# Patient Record
Sex: Male | Born: 1955 | Race: White | Hispanic: No | Marital: Married | Smoking: Former smoker
Health system: Southern US, Community
[De-identification: ages and names within clinical notes are randomized; demographics above are authoritative.]

## PROBLEM LIST (undated history)

## (undated) DIAGNOSIS — I10 Essential (primary) hypertension: Secondary | ICD-10-CM

## (undated) DIAGNOSIS — E785 Hyperlipidemia, unspecified: Secondary | ICD-10-CM

## (undated) HISTORY — PX: APPENDECTOMY: SHX54

---

## 2016-10-14 ENCOUNTER — Emergency Department: Payer: BLUE CROSS/BLUE SHIELD

## 2016-10-14 ENCOUNTER — Encounter: Payer: Self-pay | Admitting: Emergency Medicine

## 2016-10-14 ENCOUNTER — Emergency Department: Payer: BLUE CROSS/BLUE SHIELD | Admitting: Anesthesiology

## 2016-10-14 ENCOUNTER — Observation Stay
Admission: EM | Admit: 2016-10-14 | Discharge: 2016-10-15 | Disposition: A | Discharge status: Home / Self Care | Payer: BLUE CROSS/BLUE SHIELD | Attending: Orthopedic Surgery | Admitting: Orthopedic Surgery

## 2016-10-14 ENCOUNTER — Encounter
Admission: EM | Disposition: A | Discharge status: Home / Self Care | Payer: Self-pay | Source: Home / Self Care | Attending: Emergency Medicine

## 2016-10-14 DIAGNOSIS — S52572A Other intraarticular fracture of lower end of left radius, initial encounter for closed fracture: Secondary | ICD-10-CM | POA: Diagnosis not present

## 2016-10-14 DIAGNOSIS — E785 Hyperlipidemia, unspecified: Secondary | ICD-10-CM | POA: Insufficient documentation

## 2016-10-14 DIAGNOSIS — Z79899 Other long term (current) drug therapy: Secondary | ICD-10-CM | POA: Insufficient documentation

## 2016-10-14 DIAGNOSIS — Z87891 Personal history of nicotine dependence: Secondary | ICD-10-CM | POA: Insufficient documentation

## 2016-10-14 DIAGNOSIS — I10 Essential (primary) hypertension: Secondary | ICD-10-CM | POA: Insufficient documentation

## 2016-10-14 DIAGNOSIS — Z6831 Body mass index (BMI) 31.0-31.9, adult: Secondary | ICD-10-CM | POA: Insufficient documentation

## 2016-10-14 DIAGNOSIS — Y92410 Unspecified street and highway as the place of occurrence of the external cause: Secondary | ICD-10-CM | POA: Diagnosis not present

## 2016-10-14 DIAGNOSIS — R1084 Generalized abdominal pain: Secondary | ICD-10-CM | POA: Diagnosis not present

## 2016-10-14 DIAGNOSIS — E669 Obesity, unspecified: Secondary | ICD-10-CM | POA: Diagnosis not present

## 2016-10-14 DIAGNOSIS — S32018A Other fracture of first lumbar vertebra, initial encounter for closed fracture: Secondary | ICD-10-CM | POA: Diagnosis not present

## 2016-10-14 DIAGNOSIS — S20219A Contusion of unspecified front wall of thorax, initial encounter: Secondary | ICD-10-CM | POA: Diagnosis not present

## 2016-10-14 DIAGNOSIS — S32028A Other fracture of second lumbar vertebra, initial encounter for closed fracture: Secondary | ICD-10-CM | POA: Insufficient documentation

## 2016-10-14 DIAGNOSIS — S32009A Unspecified fracture of unspecified lumbar vertebra, initial encounter for closed fracture: Secondary | ICD-10-CM

## 2016-10-14 DIAGNOSIS — S52502A Unspecified fracture of the lower end of left radius, initial encounter for closed fracture: Secondary | ICD-10-CM | POA: Diagnosis present

## 2016-10-14 DIAGNOSIS — Z23 Encounter for immunization: Secondary | ICD-10-CM | POA: Insufficient documentation

## 2016-10-14 DIAGNOSIS — S62102A Fracture of unspecified carpal bone, left wrist, initial encounter for closed fracture: Secondary | ICD-10-CM

## 2016-10-14 DIAGNOSIS — R0789 Other chest pain: Secondary | ICD-10-CM

## 2016-10-14 DIAGNOSIS — S61411A Laceration without foreign body of right hand, initial encounter: Secondary | ICD-10-CM

## 2016-10-14 HISTORY — DX: Essential (primary) hypertension: I10

## 2016-10-14 HISTORY — DX: Hyperlipidemia, unspecified: E78.5

## 2016-10-14 HISTORY — PX: ORIF WRIST FRACTURE: SHX2133

## 2016-10-14 LAB — CBC
HCT: 41.6 % (ref 40.0–52.0)
Hemoglobin: 14.3 g/dL (ref 13.0–18.0)
MCH: 31 pg (ref 26.0–34.0)
MCHC: 34.5 g/dL (ref 32.0–36.0)
MCV: 89.9 fL (ref 80.0–100.0)
PLATELETS: 167 10*3/uL (ref 150–440)
RBC: 4.62 MIL/uL (ref 4.40–5.90)
RDW: 12.8 % (ref 11.5–14.5)
WBC: 16.6 10*3/uL — AB (ref 3.8–10.6)

## 2016-10-14 LAB — BASIC METABOLIC PANEL
Anion gap: 7 (ref 5–15)
BUN: 11 mg/dL (ref 6–20)
CALCIUM: 8.7 mg/dL — AB (ref 8.9–10.3)
CO2: 24 mmol/L (ref 22–32)
CREATININE: 0.87 mg/dL (ref 0.61–1.24)
Chloride: 110 mmol/L (ref 101–111)
GFR calc Af Amer: >60 mL/min (ref >60)
Glucose, Bld: 122 mg/dL — ABNORMAL HIGH (ref 65–99)
Potassium: 3.6 mmol/L (ref 3.5–5.1)
SODIUM: 141 mmol/L (ref 135–145)

## 2016-10-14 SURGERY — OPEN REDUCTION INTERNAL FIXATION (ORIF) WRIST FRACTURE
Anesthesia: General | Laterality: Left | Wound class: Clean

## 2016-10-14 MED ORDER — HYDROCODONE-ACETAMINOPHEN 7.5-325 MG PO TABS
1.0000 | ORAL_TABLET | Freq: Four times a day (QID) | ORAL | Status: DC
  Administered 2016-10-15 (×3): 1 via ORAL
  Filled 2016-10-14 (×3): qty 1

## 2016-10-14 MED ORDER — MORPHINE SULFATE (PF) 4 MG/ML IV SOLN
4.0000 mg | Freq: Once | INTRAVENOUS | Status: AC
  Administered 2016-10-14: 4 mg via INTRAVENOUS
  Filled 2016-10-14: qty 1

## 2016-10-14 MED ORDER — FENTANYL CITRATE (PF) 100 MCG/2ML IJ SOLN
INTRAMUSCULAR | Status: AC
  Administered 2016-10-14: 50 ug via INTRAVENOUS
  Filled 2016-10-14: qty 2

## 2016-10-14 MED ORDER — LIDOCAINE HCL (PF) 1 % IJ SOLN
2.0000 mL | Freq: Once | INTRAMUSCULAR | Status: DC
  Filled 2016-10-14: qty 5

## 2016-10-14 MED ORDER — GLYCOPYRROLATE 0.2 MG/ML IJ SOLN
INTRAMUSCULAR | Status: AC
  Filled 2016-10-14: qty 1

## 2016-10-14 MED ORDER — LIDOCAINE HCL (PF) 1 % IJ SOLN
INTRAMUSCULAR | Status: AC
  Filled 2016-10-14: qty 5

## 2016-10-14 MED ORDER — POTASSIUM CHLORIDE IN NACL 20-0.9 MEQ/L-% IV SOLN
INTRAVENOUS | Status: DC
  Administered 2016-10-15: 75 mL/h via INTRAVENOUS
  Filled 2016-10-14 (×3): qty 1000

## 2016-10-14 MED ORDER — HYDROMORPHONE HCL 1 MG/ML IJ SOLN
1.0000 mg | INTRAMUSCULAR | Status: DC | PRN
  Administered 2016-10-15 (×2): 1 mg via INTRAVENOUS
  Filled 2016-10-14 (×3): qty 1

## 2016-10-14 MED ORDER — BUPIVACAINE HCL 0.5 % IJ SOLN
5.0000 mL | Freq: Once | INTRAMUSCULAR | Status: DC
  Filled 2016-10-14: qty 5

## 2016-10-14 MED ORDER — OXYCODONE HCL 5 MG PO TABS
5.0000 mg | ORAL_TABLET | ORAL | Status: DC | PRN
  Administered 2016-10-15: 10 mg via ORAL
  Filled 2016-10-14: qty 2

## 2016-10-14 MED ORDER — ACETAMINOPHEN 650 MG RE SUPP: 650.0000 mg | Freq: Four times a day (QID) | RECTAL | Status: DC | PRN

## 2016-10-14 MED ORDER — BUPIVACAINE HCL (PF) 0.5 % IJ SOLN
INTRAMUSCULAR | Status: AC
  Filled 2016-10-14: qty 30

## 2016-10-14 MED ORDER — ONDANSETRON HCL 4 MG/2ML IJ SOLN
INTRAMUSCULAR | Status: AC
  Filled 2016-10-14: qty 2

## 2016-10-14 MED ORDER — FENTANYL CITRATE (PF) 250 MCG/5ML IJ SOLN
INTRAMUSCULAR | Status: AC
  Filled 2016-10-14: qty 5

## 2016-10-14 MED ORDER — OXYCODONE HCL 5 MG/5ML PO SOLN: 5.0000 mg | Freq: Once | ORAL | Status: DC | PRN

## 2016-10-14 MED ORDER — LIDOCAINE HCL (PF) 2 % IJ SOLN
INTRAMUSCULAR | Status: AC
  Filled 2016-10-14: qty 2

## 2016-10-14 MED ORDER — SUCCINYLCHOLINE CHLORIDE 20 MG/ML IJ SOLN
INTRAMUSCULAR | Status: DC | PRN
  Administered 2016-10-14: 100 mg via INTRAVENOUS

## 2016-10-14 MED ORDER — GLYCOPYRROLATE 0.2 MG/ML IJ SOLN
INTRAMUSCULAR | Status: DC | PRN
  Administered 2016-10-14: 0.2 mg via INTRAVENOUS

## 2016-10-14 MED ORDER — MEPERIDINE HCL 50 MG/ML IJ SOLN: 6.2500 mg | INTRAMUSCULAR | Status: DC | PRN

## 2016-10-14 MED ORDER — IOPAMIDOL (ISOVUE-300) INJECTION 61%
100.0000 mL | Freq: Once | INTRAVENOUS | Status: AC | PRN
  Administered 2016-10-14: 100 mL via INTRAVENOUS
  Filled 2016-10-14: qty 100

## 2016-10-14 MED ORDER — FENTANYL CITRATE (PF) 100 MCG/2ML IJ SOLN
INTRAMUSCULAR | Status: DC | PRN
  Administered 2016-10-14 (×5): 50 ug via INTRAVENOUS

## 2016-10-14 MED ORDER — DIPHENHYDRAMINE HCL 12.5 MG/5ML PO ELIX
12.5000 mg | ORAL_SOLUTION | ORAL | Status: DC | PRN
  Administered 2016-10-15: 12.5 mg via ORAL
  Filled 2016-10-14: qty 5

## 2016-10-14 MED ORDER — HYDROMORPHONE HCL 1 MG/ML IJ SOLN
INTRAMUSCULAR | Status: AC
  Administered 2016-10-15: 0.5 mg via INTRAVENOUS
  Filled 2016-10-14: qty 1

## 2016-10-14 MED ORDER — FENTANYL CITRATE (PF) 100 MCG/2ML IJ SOLN
25.0000 ug | INTRAMUSCULAR | Status: DC | PRN
  Administered 2016-10-14 – 2016-10-15 (×2): 50 ug via INTRAVENOUS

## 2016-10-14 MED ORDER — LIDOCAINE HCL (CARDIAC) 20 MG/ML IV SOLN
INTRAVENOUS | Status: DC | PRN
  Administered 2016-10-14: 50 mg via INTRAVENOUS

## 2016-10-14 MED ORDER — MIDAZOLAM HCL 2 MG/2ML IJ SOLN
INTRAMUSCULAR | Status: AC
  Filled 2016-10-14: qty 2

## 2016-10-14 MED ORDER — TETANUS-DIPHTH-ACELL PERTUSSIS 5-2.5-18.5 LF-MCG/0.5 IM SUSP
0.5000 mL | Freq: Once | INTRAMUSCULAR | Status: AC
  Administered 2016-10-14: 0.5 mL via INTRAMUSCULAR
  Filled 2016-10-14: qty 0.5

## 2016-10-14 MED ORDER — LACTATED RINGERS IV SOLN
INTRAVENOUS | Status: DC | PRN
  Administered 2016-10-14: 22:00:00 via INTRAVENOUS

## 2016-10-14 MED ORDER — PROPOFOL 10 MG/ML IV BOLUS
INTRAVENOUS | Status: AC
  Filled 2016-10-14: qty 20

## 2016-10-14 MED ORDER — LIDOCAINE HCL 1 % IJ SOLN
5.0000 mL | Freq: Once | INTRAMUSCULAR | Status: AC
  Administered 2016-10-14: 5 mL via INTRADERMAL
  Filled 2016-10-14: qty 5

## 2016-10-14 MED ORDER — MIDAZOLAM HCL 2 MG/2ML IJ SOLN
INTRAMUSCULAR | Status: DC | PRN
  Administered 2016-10-14: 2 mg via INTRAVENOUS

## 2016-10-14 MED ORDER — PROPOFOL 10 MG/ML IV BOLUS
INTRAVENOUS | Status: DC | PRN
  Administered 2016-10-14: 30 mg via INTRAVENOUS
  Administered 2016-10-14: 170 mg via INTRAVENOUS

## 2016-10-14 MED ORDER — SUCCINYLCHOLINE CHLORIDE 20 MG/ML IJ SOLN
INTRAMUSCULAR | Status: AC
  Filled 2016-10-14: qty 1

## 2016-10-14 MED ORDER — ACETAMINOPHEN 325 MG PO TABS: 650.0000 mg | ORAL_TABLET | Freq: Four times a day (QID) | ORAL | Status: DC | PRN

## 2016-10-14 MED ORDER — OXYCODONE HCL 5 MG PO TABS: 5.0000 mg | ORAL_TABLET | Freq: Once | ORAL | Status: DC | PRN

## 2016-10-14 MED ORDER — MORPHINE SULFATE (PF) 2 MG/ML IV SOLN: 2.0000 mg | Freq: Once | INTRAVENOUS | Status: DC

## 2016-10-14 MED ORDER — PROMETHAZINE HCL 25 MG/ML IJ SOLN: 6.2500 mg | INTRAMUSCULAR | Status: DC | PRN

## 2016-10-14 MED ORDER — ONDANSETRON HCL 4 MG/2ML IJ SOLN
INTRAMUSCULAR | Status: DC | PRN
  Administered 2016-10-14: 4 mg via INTRAVENOUS

## 2016-10-14 MED ORDER — ACETAMINOPHEN 500 MG PO TABS
1000.0000 mg | ORAL_TABLET | Freq: Four times a day (QID) | ORAL | Status: DC
  Administered 2016-10-15 (×2): 1000 mg via ORAL
  Filled 2016-10-14 (×3): qty 2

## 2016-10-14 MED ORDER — DOCUSATE SODIUM 100 MG PO CAPS
100.0000 mg | ORAL_CAPSULE | Freq: Two times a day (BID) | ORAL | Status: DC
  Administered 2016-10-15: 100 mg via ORAL
  Filled 2016-10-14: qty 1

## 2016-10-14 SURGICAL SUPPLY — 41 items
BANDAGE ACE 3X5.8 VEL STRL LF (GAUZE/BANDAGES/DRESSINGS) ×3 IMPLANT
BNDG ESMARK 4X12 TAN STRL LF (GAUZE/BANDAGES/DRESSINGS) ×3 IMPLANT
CANISTER SUCT 1200ML W/VALVE (MISCELLANEOUS) ×3 IMPLANT
CLOSURE WOUND 1/2 X4 (GAUZE/BANDAGES/DRESSINGS) ×1
CUFF TOURN 18 STER (MISCELLANEOUS) ×3 IMPLANT
DRAPE FLUOR MINI C-ARM 54X84 (DRAPES) ×3 IMPLANT
DRILL BIT WRIST (BIT) ×3 IMPLANT
DRSG DERMACEA 8X12 NADH (GAUZE/BANDAGES/DRESSINGS) ×3 IMPLANT
DURAPREP 26ML APPLICATOR (WOUND CARE) ×3 IMPLANT
ELECT REM PT RETURN 9FT ADLT (ELECTROSURGICAL) ×3
ELECTRODE REM PT RTRN 9FT ADLT (ELECTROSURGICAL) ×1 IMPLANT
GAUZE SPONGE 4X4 12PLY STRL (GAUZE/BANDAGES/DRESSINGS) ×3 IMPLANT
GLOVE BIOGEL M STRL SZ7.5 (GLOVE) ×9 IMPLANT
GLOVE INDICATOR 8.0 STRL GRN (GLOVE) ×9 IMPLANT
GOWN STRL REUS W/ TWL LRG LVL3 (GOWN DISPOSABLE) ×2 IMPLANT
GOWN STRL REUS W/TWL LRG LVL3 (GOWN DISPOSABLE) ×4
KIT RM TURNOVER STRD PROC AR (KITS) ×3 IMPLANT
NEEDLE FILTER BLUNT 18X 1/2SAF (NEEDLE) ×2
NEEDLE FILTER BLUNT 18X1 1/2 (NEEDLE) ×1 IMPLANT
NS IRRIG 500ML POUR BTL (IV SOLUTION) ×3 IMPLANT
PACK EXTREMITY ARMC (MISCELLANEOUS) ×3 IMPLANT
PAD CAST CTTN 4X4 STRL (SOFTGOODS) ×2 IMPLANT
PADDING CAST COTTON 4X4 STRL (SOFTGOODS) ×4
PLATE RADIUS DISTAL VARIAX (Plate) ×3 IMPLANT
SCREW BONE 2.7X14 (Screw) ×3 IMPLANT
SCREW BONE 2.7X26MM (Screw) ×3 IMPLANT
SCREW LOCKING 2.7X16 (Screw) ×6 IMPLANT
SCREW LOCKING 2.7X18 (Screw) ×3 IMPLANT
SCREW LOCKING 2.7X20 (Screw) ×9 IMPLANT
SCREW LOCKING 2.7X22 (Screw) ×3 IMPLANT
SCREW LOCKING 2.7X24MM (Screw) ×6 IMPLANT
SCREW LOCKING 2.7X26MM (Screw) ×3 IMPLANT
SPLINT CAST 1 STEP 3X12 (MISCELLANEOUS) ×3 IMPLANT
SPONGE LAP 18X18 5 PK (GAUZE/BANDAGES/DRESSINGS) ×3 IMPLANT
STOCKINETTE STRL 4IN 9604848 (GAUZE/BANDAGES/DRESSINGS) ×3 IMPLANT
STRIP CLOSURE SKIN 1/2X4 (GAUZE/BANDAGES/DRESSINGS) ×2 IMPLANT
SUT ETHILON 2 0 FS 18 (SUTURE) ×3 IMPLANT
SUT VIC AB 0 CT2 27 (SUTURE) ×3 IMPLANT
SUT VIC AB 2-0 SH 27 (SUTURE) ×2
SUT VIC AB 2-0 SH 27XBRD (SUTURE) ×1 IMPLANT
SYRINGE 10CC LL (SYRINGE) ×3 IMPLANT

## 2016-10-14 NOTE — ED Notes (Signed)
Family at bedside.  Informed that patient will be going to OR shortly.  Understanding verbalized.
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
## 2016-10-14 NOTE — Anesthesia Post-op Follow-up Note (Cosign Needed)
Anesthesia QCDR form completed.        
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
## 2016-10-14 NOTE — Anesthesia Procedure Notes (Signed)
Procedure Name: Intubation Performed by: Mathews ArgyleLOGAN, Adam Delgado Pre-anesthesia Checklist: Patient identified, Patient being monitored, Timeout performed, Emergency Drugs available and Suction available Patient Re-evaluated:Patient Re-evaluated prior to induction Oxygen Delivery Method: Circle system utilized Preoxygenation: Pre-oxygenation with 100% oxygen Induction Type: IV induction, Rapid sequence and Cricoid Pressure applied Ventilation: Mask ventilation without difficulty Laryngoscope Size: McGraph and 4 Grade View: Grade III Tube type: Oral Tube size: 7.5 mm Number of attempts: 1 Airway Equipment and Method: Stylet and Video-laryngoscopy Placement Confirmation: ETT inserted through vocal cords under direct vision,  positive ETCO2 and breath sounds checked- equal and bilateral Secured at: 23 cm Tube secured with: Tape Dental Injury: Teeth and Oropharynx as per pre-operative assessment  Difficulty Due To: Difficulty was unanticipated, Difficult Airway- due to large tongue and Difficult Airway- due to immobile epiglottis Future Recommendations: Recommend- induction with short-acting agent, and alternative techniques readily available
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
## 2016-10-14 NOTE — Transfer of Care (Signed)
Immediate Anesthesia Transfer of Care Note  Patient: Adam Delgado  Procedure(s) Performed: Procedure(s): OPEN REDUCTION INTERNAL FIXATION (ORIF) WRIST FRACTURE (Left)  Patient Location: PACU  Anesthesia Type:General  Level of Consciousness: awake and alert   Airway & Oxygen Therapy: Patient Spontanous Breathing and Patient connected to face mask oxygen  Post-op Assessment: Report given to RN and Post -op Vital signs reviewed and stable  Post vital signs: Reviewed  Last Vitals:  Vitals:   10/14/16 1900 10/14/16 2345  BP: 110/78 (!) 154/86  Pulse:    Resp: 16 20  Temp:  37.6 C    Last Pain:  Vitals:   10/14/16 1630  TempSrc:   PainSc: 10-Worst pain ever      Patients Stated Pain Goal: 2 (10/14/16 1630)  Complications: No apparent anesthesia complications

## 2016-10-15 LAB — GLUCOSE, CAPILLARY: GLUCOSE-CAPILLARY: 134 mg/dL — AB (ref 65–99)

## 2016-10-15 MED ORDER — HYDROMORPHONE HCL 1 MG/ML IJ SOLN
INTRAMUSCULAR | Status: AC
  Filled 2016-10-15: qty 1

## 2016-10-15 MED ORDER — METOCLOPRAMIDE HCL 5 MG/ML IJ SOLN: 5.0000 mg | Freq: Three times a day (TID) | INTRAMUSCULAR | Status: DC | PRN

## 2016-10-15 MED ORDER — DOCUSATE SODIUM 100 MG PO CAPS: 100.0000 mg | ORAL_CAPSULE | Freq: Two times a day (BID) | ORAL | 0 refills | Status: AC

## 2016-10-15 MED ORDER — HYDROCODONE-ACETAMINOPHEN 7.5-325 MG PO TABS: 1.0000 | ORAL_TABLET | Freq: Four times a day (QID) | ORAL | 0 refills | Status: AC

## 2016-10-15 MED ORDER — HYDROMORPHONE HCL 1 MG/ML IJ SOLN
0.5000 mg | INTRAMUSCULAR | Status: DC | PRN
  Administered 2016-10-15 (×3): 0.5 mg via INTRAVENOUS

## 2016-10-15 MED ORDER — METOCLOPRAMIDE HCL 10 MG PO TABS: 5.0000 mg | ORAL_TABLET | Freq: Three times a day (TID) | ORAL | Status: DC | PRN

## 2016-10-15 MED ORDER — ONDANSETRON HCL 4 MG/2ML IJ SOLN: 4.0000 mg | Freq: Four times a day (QID) | INTRAMUSCULAR | Status: DC | PRN

## 2016-10-15 MED ORDER — ONDANSETRON HCL 4 MG PO TABS: 4.0000 mg | ORAL_TABLET | Freq: Four times a day (QID) | ORAL | Status: DC | PRN
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
## 2016-10-15 NOTE — Progress Notes (Signed)
Patient will be discharged to home today. Waiting on delivery of bariatric walker with arm rest to be delivered in order for the patient to go home. Family and patient are feeling a little impatient and want to go home without the walker. This nurse told the patient that it is important to go home with the equipment needed in order to get around safely. Explained that deliveries on Sunday may take a little longer than during the week.
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
## 2016-10-15 NOTE — Progress Notes (Signed)
Subjective: 61 years old male postoperative day 1 status post open reduction internal fixation left distal radius fracture dislocation. No complaints of acute pain at this time.   Objective: Vital signs in last 24 hours: Temp:  [98.3 F (36.8 C)-99.6 F (37.6 C)] 99.2 F (37.3 C) (07/29 0743) Pulse Rate:  [73-115] 103 (07/29 0743) Resp:  [10-20] 19 (07/29 0743) BP: (95-154)/(67-88) 95/67 (07/29 0743) SpO2:  [95 %-100 %] 97 % (07/29 0743) Weight:  [99.8 kg (220 lb)] 99.8 kg (220 lb) (07/28 1629)  Intake/Output from previous day: 07/28 0701 - 07/29 0700 In: 1148.8 [P.O.:240; I.V.:908.8] Out: 350 [Urine:350] Intake/Output this shift: No intake/output data recorded.   Recent Labs  10/14/16 1931  HGB 14.3    Recent Labs  10/14/16 1931  WBC 16.6*  RBC 4.62  HCT 41.6  PLT 167    Recent Labs  10/14/16 1931  NA 141  K 3.6  CL 110  CO2 24  BUN 11  CREATININE 0.87  GLUCOSE 122*  CALCIUM 8.7*   No results for input(s): LABPT, INR in the last 72 hours.  Patient currently has a volar splint in place. He has intact flexion and extension of all the digits. Neurovascular intact  Brisk capillary refill is present. Sensations are intact in radial and ulnar border of all the digits.  Assessment/Plan: 61 years old male postoperative day 1 status post open reduction internal fixation left distal radius intra-articular fracture. Patient has concomitant rib contusions as well as transverse process fractures involving his lumbar spine. Patient will work with physical therapy today. [place] [place] Patient will be discharged home today. He will follow[place] [facility] [place] [facility] . Patient will have the stitches removed in 2 weeks. He will be switched to a short arm cast for 4 weeks.    Bedelia Pong 10/15/2016, 10:30 AM

## 2016-10-15 NOTE — Anesthesia Postprocedure Evaluation (Signed)
Anesthesia Post Note  Patient: MudloggerJohnny Morrical  Procedure(s) Performed: Procedure(s) (LRB): OPEN REDUCTION INTERNAL FIXATION (ORIF) WRIST FRACTURE (Left)  Patient location during evaluation: PACU Anesthesia Type: General Level of consciousness: awake and alert and oriented Pain management: pain level controlled Vital Signs Assessment: post-procedure vital signs reviewed and stable Respiratory status: spontaneous breathing, nonlabored ventilation and respiratory function stable Cardiovascular status: blood pressure returned to baseline and stable Postop Assessment: no signs of nausea or vomiting Anesthetic complications: no     Last Vitals:  Vitals:   10/15/16 0046 10/15/16 0131  BP: 114/70 136/67  Pulse: 98 94  Resp: 13 18  Temp: 37.4 C 37 C    Last Pain:  Vitals:   10/15/16 0142  TempSrc:   PainSc: 8                  Dorcus Riga

## 2016-10-15 NOTE — Pre-Procedure Instructions (Signed)
Note needs to be done by Ortho

## 2016-10-15 NOTE — Progress Notes (Signed)
Patient is being discharged to home. DC & RX instructions given and family acknowledged understanding. Walker delivered. IV removed. Son here to drive patient home.

## 2016-10-15 NOTE — Care Management Obs Status (Signed)
MEDICARE OBSERVATION STATUS NOTIFICATION   Patient Details  Name: Adam Delgado MRN: [id number] Date of Birth: 03-18-1956   Medicare Observation Status Notification Given:  No  Discharged within 24 hours    Lamisha Roussell A, RN 10/15/2016, 11:03 AM

## 2016-10-16 ENCOUNTER — Encounter: Payer: Self-pay | Admitting: Orthopedic Surgery
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
